# Patient Record
Sex: Male | Born: 1960 | Race: White | Hispanic: No | Marital: Married | Smoking: Current some day smoker
Health system: Southern US, Community
[De-identification: ages and names within clinical notes are randomized; demographics above are authoritative.]

## PROBLEM LIST (undated history)

## (undated) DIAGNOSIS — E119 Type 2 diabetes mellitus without complications: Secondary | ICD-10-CM

## (undated) DIAGNOSIS — L4050 Arthropathic psoriasis, unspecified: Secondary | ICD-10-CM

## (undated) DIAGNOSIS — I10 Essential (primary) hypertension: Secondary | ICD-10-CM

## (undated) DIAGNOSIS — K219 Gastro-esophageal reflux disease without esophagitis: Secondary | ICD-10-CM

## (undated) HISTORY — PX: CARDIAC CATHETERIZATION: SHX172

## (undated) HISTORY — PX: CARPAL TUNNEL RELEASE: SHX101

## (undated) HISTORY — PX: APPENDECTOMY: SHX54

---

## 2003-02-18 HISTORY — PX: CORONARY ARTERY BYPASS GRAFT: SHX141

## 2004-01-17 ENCOUNTER — Ambulatory Visit: Payer: Self-pay | Admitting: Cardiology

## 2004-02-27 ENCOUNTER — Encounter: Payer: Self-pay | Admitting: Cardiology

## 2004-03-20 ENCOUNTER — Encounter: Payer: Self-pay | Admitting: Cardiology

## 2004-04-17 ENCOUNTER — Encounter: Payer: Self-pay | Admitting: Cardiology

## 2008-02-26 ENCOUNTER — Emergency Department: Payer: Self-pay

## 2009-03-30 ENCOUNTER — Ambulatory Visit: Payer: Self-pay | Admitting: Cardiology

## 2010-10-04 ENCOUNTER — Ambulatory Visit: Payer: Self-pay | Admitting: Internal Medicine

## 2016-01-31 ENCOUNTER — Telehealth: Payer: Self-pay | Admitting: Gastroenterology

## 2016-01-31 NOTE — Telephone Encounter (Signed)
colonoscopy

## 2016-02-05 ENCOUNTER — Telehealth: Payer: Self-pay | Admitting: Gastroenterology

## 2016-02-05 NOTE — Telephone Encounter (Signed)
Patient returning a call regarding a colonoscopy °

## 2016-02-05 NOTE — Telephone Encounter (Signed)
LVM for patient to return call to schedule colonoscopy.

## 2016-02-08 NOTE — Telephone Encounter (Signed)
Patient is returning your call regarding a colonoscopy °

## 2016-02-13 ENCOUNTER — Other Ambulatory Visit: Payer: Self-pay

## 2016-02-13 NOTE — Telephone Encounter (Signed)
Gastroenterology Pre-Procedure Review  Request Date:  Requesting Physician: Dr.   PATIENT REVIEW QUESTIONS: The patient responded to the following health history questions as indicated:    1. Are you having any GI issues? no 2. Do you have a personal history of Polyps? no 3. Do you have a family history of Colon Cancer or Polyps? no 4. Diabetes Mellitus? yes (Type 2) 5. Joint replacements in the past 12 months?no 6. Major health problems in the past 3 months?no 7. Any artificial heart valves, MVP, or defibrillator? Stent x 4 years - triple bypass x 10 years   MEDICATIONS & ALLERGIES:    Patient reports the following regarding taking any anticoagulation/antiplatelet therapy:   Plavix, Coumadin, Eliquis, Xarelto, Lovenox, Pradaxa, Brilinta, or Effient? yes (Plavix 75mg  )  Dr. Ubaldo Glassing Aspirin? yes (ASA 325mg )   Patient confirms/reports the following medications:  No current outpatient prescriptions on file.   No current facility-administered medications for this visit.     Patient confirms/reports the following allergies:  Allergies not on file  No orders of the defined types were placed in this encounter.   AUTHORIZATION INFORMATION Primary Insurance: 1D#: Group #:  Secondary Insurance: 1D#: Group #:  SCHEDULE INFORMATION: Date: 03/07/16 Time: Location: [place]

## 2016-02-15 NOTE — Telephone Encounter (Signed)
Pt scheduled for a screening colonoscopy on Friday, Jan 19th in [facility] with [facility].   Please precert.

## 2016-02-19 ENCOUNTER — Telehealth: Payer: Self-pay

## 2016-02-19 NOTE — Telephone Encounter (Signed)
The notification/prior authorization case information was transmitted on 02/19/2016 at 10:17 AM CST. The notification/prior authorization reference number is [id number].

## 2016-02-29 ENCOUNTER — Telehealth: Payer: Self-pay

## 2016-02-29 NOTE — Telephone Encounter (Signed)
LVM for pt to return my call. Advised pt per Dr. Ubaldo Glassing on VM, Ok to stop Plavix 75mg  days prior to colonoscopy that is currently scheduled for 03/07/16 and to restart 1 day after.

## 2016-03-03 ENCOUNTER — Encounter: Payer: Self-pay | Admitting: *Deleted

## 2016-03-04 NOTE — Discharge Instructions (Signed)

## 2016-03-07 ENCOUNTER — Ambulatory Visit: Payer: 59 | Admitting: Anesthesiology

## 2016-03-07 ENCOUNTER — Encounter
Admission: RE | Disposition: A | Discharge status: Home / Self Care | Payer: Self-pay | Source: Ambulatory Visit | Attending: Gastroenterology

## 2016-03-07 ENCOUNTER — Ambulatory Visit
Admission: RE | Admit: 2016-03-07 | Discharge: 2016-03-07 | Disposition: A | Discharge status: Home / Self Care | Payer: 59 | Source: Ambulatory Visit | Attending: Gastroenterology | Admitting: Gastroenterology

## 2016-03-07 DIAGNOSIS — K5730 Diverticulosis of large intestine without perforation or abscess without bleeding: Secondary | ICD-10-CM | POA: Insufficient documentation

## 2016-03-07 DIAGNOSIS — Z1211 Encounter for screening for malignant neoplasm of colon: Secondary | ICD-10-CM | POA: Diagnosis present

## 2016-03-07 DIAGNOSIS — Z7982 Long term (current) use of aspirin: Secondary | ICD-10-CM | POA: Diagnosis not present

## 2016-03-07 DIAGNOSIS — F17290 Nicotine dependence, other tobacco product, uncomplicated: Secondary | ICD-10-CM | POA: Insufficient documentation

## 2016-03-07 DIAGNOSIS — K640 First degree hemorrhoids: Secondary | ICD-10-CM | POA: Insufficient documentation

## 2016-03-07 DIAGNOSIS — I10 Essential (primary) hypertension: Secondary | ICD-10-CM | POA: Insufficient documentation

## 2016-03-07 DIAGNOSIS — Z951 Presence of aortocoronary bypass graft: Secondary | ICD-10-CM | POA: Insufficient documentation

## 2016-03-07 DIAGNOSIS — Z79899 Other long term (current) drug therapy: Secondary | ICD-10-CM | POA: Insufficient documentation

## 2016-03-07 DIAGNOSIS — Z7984 Long term (current) use of oral hypoglycemic drugs: Secondary | ICD-10-CM | POA: Insufficient documentation

## 2016-03-07 DIAGNOSIS — K219 Gastro-esophageal reflux disease without esophagitis: Secondary | ICD-10-CM | POA: Diagnosis not present

## 2016-03-07 DIAGNOSIS — E119 Type 2 diabetes mellitus without complications: Secondary | ICD-10-CM | POA: Diagnosis not present

## 2016-03-07 DIAGNOSIS — D122 Benign neoplasm of ascending colon: Secondary | ICD-10-CM | POA: Diagnosis not present

## 2016-03-07 DIAGNOSIS — Z7902 Long term (current) use of antithrombotics/antiplatelets: Secondary | ICD-10-CM | POA: Insufficient documentation

## 2016-03-07 DIAGNOSIS — L539 Erythematous condition, unspecified: Secondary | ICD-10-CM | POA: Diagnosis not present

## 2016-03-07 HISTORY — PX: POLYPECTOMY: SHX5525

## 2016-03-07 HISTORY — DX: Gastro-esophageal reflux disease without esophagitis: K21.9

## 2016-03-07 HISTORY — DX: Essential (primary) hypertension: I10

## 2016-03-07 HISTORY — DX: Type 2 diabetes mellitus without complications: E11.9

## 2016-03-07 HISTORY — PX: COLONOSCOPY WITH PROPOFOL: SHX5780

## 2016-03-07 LAB — GLUCOSE, CAPILLARY
GLUCOSE-CAPILLARY: 152 mg/dL — AB (ref 65–99)
Glucose-Capillary: 135 mg/dL — ABNORMAL HIGH (ref 65–99)

## 2016-03-07 SURGERY — COLONOSCOPY WITH PROPOFOL
Anesthesia: Monitor Anesthesia Care | Wound class: Contaminated

## 2016-03-07 MED ORDER — PROPOFOL 10 MG/ML IV BOLUS
INTRAVENOUS | Status: DC | PRN
  Administered 2016-03-07: 20 mg via INTRAVENOUS
  Administered 2016-03-07 (×2): 40 mg via INTRAVENOUS
  Administered 2016-03-07: 70 mg via INTRAVENOUS
  Administered 2016-03-07: 20 mg via INTRAVENOUS
  Administered 2016-03-07: 30 mg via INTRAVENOUS
  Administered 2016-03-07: 20 mg via INTRAVENOUS

## 2016-03-07 MED ORDER — STERILE WATER FOR IRRIGATION IR SOLN
Status: DC | PRN
  Administered 2016-03-07: 08:00:00

## 2016-03-07 MED ORDER — LIDOCAINE HCL (CARDIAC) 20 MG/ML IV SOLN
INTRAVENOUS | Status: DC | PRN
  Administered 2016-03-07: 40 mg via INTRAVENOUS

## 2016-03-07 MED ORDER — LACTATED RINGERS IV SOLN
INTRAVENOUS | Status: DC
  Administered 2016-03-07: 08:00:00 via INTRAVENOUS

## 2016-03-07 SURGICAL SUPPLY — 23 items

## 2016-03-07 NOTE — Anesthesia Procedure Notes (Signed)
Procedure Name: MAC Performed by: Mayme Genta Pre-anesthesia Checklist: Patient identified, Emergency Drugs available, Suction available, Timeout performed and Patient being monitored Patient Re-evaluated:Patient Re-evaluated prior to inductionOxygen Delivery Method: Nasal cannula Placement Confirmation: positive ETCO2

## 2016-03-07 NOTE — Anesthesia Preprocedure Evaluation (Signed)
Anesthesia Evaluation  Patient identified by MRN, date of birth, ID band Patient awake    Reviewed: Allergy & Precautions, H&P , NPO status , Patient's Chart, lab work & pertinent test results, reviewed documented beta blocker date and time   Airway Mallampati: II  TM Distance: >3 FB Neck ROM: full    Dental no notable dental hx.    Pulmonary Current Smoker,    Pulmonary exam normal breath sounds clear to auscultation       Cardiovascular Exercise Tolerance: Good hypertension,  Rhythm:regular Rate:Normal     Neuro/Psych negative neurological ROS  negative psych ROS   GI/Hepatic Neg liver ROS, GERD  ,  Endo/Other  diabetes, Type 2  Renal/GU negative Renal ROS  negative genitourinary   Musculoskeletal   Abdominal   Peds  Hematology negative hematology ROS (+)   Anesthesia Other Findings   Reproductive/Obstetrics negative OB ROS                             Anesthesia Physical Anesthesia Plan  ASA: II  Anesthesia Plan: MAC   Post-op Pain Management:    Induction:   Airway Management Planned:   Additional Equipment:   Intra-op Plan:   Post-operative Plan:   Informed Consent: I have reviewed the patients History and Physical, chart, labs and discussed the procedure including the risks, benefits and alternatives for the proposed anesthesia with the patient or authorized representative who has indicated his/her understanding and acceptance.   Dental Advisory Given  Plan Discussed with: CRNA  Anesthesia Plan Comments:         Anesthesia Quick Evaluation

## 2016-03-07 NOTE — Op Note (Signed)
[facility] Gastroenterology Patient Name: Isaiah Harrison Procedure Date: 03/07/2016 8:00 AM MRN: [id number] Account #: [id number] Date of Birth: 1961/02/03 Admit Type: Outpatient Age: 56 Room: [facility] OR ROOM 01 Gender: Male Note Status: Finalized Procedure:            Colonoscopy Indications:          Screening for colorectal malignant neoplasm Providers:            Lucilla Lame MD, MD Referring MD:         Rusty Aus, MD (Referring MD) Medicines:            Propofol per Anesthesia Complications:        No immediate complications. Procedure:            Pre-Anesthesia Assessment:                       - Prior to the procedure, a History and Physical was                        performed, and patient medications and allergies were                        reviewed. The patient's tolerance of previous                        anesthesia was also reviewed. The risks and benefits of                        the procedure and the sedation options and risks were                        discussed with the patient. All questions were                        answered, and informed consent was obtained. Prior                        Anticoagulants: The patient has taken no previous                        anticoagulant or antiplatelet agents. ASA Grade                        Assessment: II - A patient with mild systemic disease.                        After reviewing the risks and benefits, the patient was                        deemed in satisfactory condition to undergo the                        procedure.                       After obtaining informed consent, the colonoscope was                        passed under direct vision. Throughout the procedure,  the patient's blood pressure, pulse, and oxygen                        saturations were monitored continuously. The was                        introduced through the anus and advanced to the the             cecum, identified by appendiceal orifice and ileocecal                        valve. The colonoscopy was performed without                        difficulty. The patient tolerated the procedure well.                        The quality of the bowel preparation was excellent. Findings:      The perianal and digital rectal examinations were normal.      A 5 mm polyp was found in the ascending colon. The polyp was sessile.       The polyp was removed with a cold biopsy forceps. Resection and       retrieval were complete.      A patchy area of mildly erythematous mucosa was found in the sigmoid       colon. Biopsies were taken with a cold forceps for histology.      Non-bleeding internal hemorrhoids were found during retroflexion. The       hemorrhoids were Grade I (internal hemorrhoids that do not prolapse).      Multiple small-mouthed diverticula were found in the sigmoid colon. Impression:           - One 5 mm polyp in the ascending colon, removed with a                        cold biopsy forceps. Resected and retrieved.                       - Erythematous mucosa in the sigmoid colon. Biopsied.                       - Non-bleeding internal hemorrhoids.                       - Diverticulosis in the sigmoid colon. Recommendation:       - Discharge patient to home.                       - Resume previous diet.                       - Continue present medications.                       - Await pathology results.                       - Repeat colonoscopy in 5 years if polyp adenoma and 10                        years if hyperplastic Procedure Code(s):    ---  Professional ---                       [phone], Colonoscopy, flexible; with biopsy, single or                        multiple Diagnosis Code(s):    --- Professional ---                       Z12.11, Encounter for screening for malignant neoplasm                        of colon                       D12.2, Benign neoplasm of  ascending colon CPT copyright 2016 American Medical Association. All rights reserved. The codes documented in this report are preliminary and upon coder review may  be revised to meet current compliance requirements. Lucilla Lame MD, MD 03/07/2016 8:31:00 AM This report has been signed electronically. Number of Addenda: 0 Note Initiated On: 03/07/2016 8:00 AM Scope Withdrawal Time: 0 hours 7 minutes 26 seconds  Total Procedure Duration: 0 hours 9 minutes 27 seconds       [facility]

## 2016-03-07 NOTE — H&P (Signed)
Lucilla Lame, MD [facility] [address], [place], [place] [address] Phone: [phone] Fax : [phone]  Primary Care Physician:  No primary care provider on file. Primary Gastroenterologist:  Dr. Allen Norris  Pre-Procedure History & Physical: HPI:  Isaiah Harrison is a 56 y.o. male is here for a screening colonoscopy.   Past Medical History:  Diagnosis Date  . Diabetes mellitus without complication ([place])   . GERD (gastroesophageal reflux disease)   . Hypertension     Past Surgical History:  Procedure Laterality Date  . APPENDECTOMY    . CARDIAC CATHETERIZATION     stent placed (several yrs ago)  . CARPAL TUNNEL RELEASE Bilateral   . CORONARY ARTERY BYPASS GRAFT  2005   3 vessel. [facility]    Prior to Admission medications   Medication Sig Start Date End Date Taking? Authorizing Provider  amLODipine-benazepril (LOTREL) 5-20 MG capsule Take 1 capsule by mouth daily.   Yes Historical Provider, MD  aspirin 325 MG tablet Take 325 mg by mouth daily.   Yes Historical Provider, MD  atorvastatin (LIPITOR) 40 MG tablet Take 40 mg by mouth daily.   Yes Historical Provider, MD  clopidogrel (PLAVIX) 75 MG tablet Take 75 mg by mouth daily.   Yes Historical Provider, MD  doxazosin (CARDURA) 4 MG tablet Take 4 mg by mouth daily.   Yes Historical Provider, MD  etodolac (LODINE) 400 MG tablet Take 400 mg by mouth daily.   Yes Historical Provider, MD  gemfibrozil (LOPID) 600 MG tablet Take 600 mg by mouth 2 (two) times daily before a meal.   Yes Historical Provider, MD  isosorbide mononitrate (IMDUR) 30 MG 24 hr tablet Take 30 mg by mouth daily.   Yes Historical Provider, MD  metFORMIN (GLUCOPHAGE) 500 MG tablet Take by mouth 2 (two) times daily with a meal.   Yes Historical Provider, MD  metoprolol (LOPRESSOR) 100 MG tablet Take 100 mg by mouth daily.   Yes Historical Provider, MD  nitroGLYCERIN (NITROSTAT) 0.4 MG SL tablet Place 0.4 mg under the tongue every 5 (five) minutes as needed for chest  pain.   Yes Historical Provider, MD  pantoprazole (PROTONIX) 40 MG tablet Take 40 mg by mouth daily.   Yes Historical Provider, MD  Probiotic Product (PROBIOTIC DAILY PO) Take by mouth daily.   Yes Historical Provider, MD    Allergies as of 02/13/2016  . (No Known Allergies)    History reviewed. No pertinent family history.  Social History   Social History  . Marital status: Married    Spouse name: N/A  . Number of children: N/A  . Years of education: N/A   Occupational History  . Not on file.   Social History Main Topics  . Smoking status: Current Some Day Smoker    Types: Cigars  . Smokeless tobacco: Never Used     Comment: occasional  . Alcohol use 16.8 oz/week    28 Shots of liquor per week  . Drug use: Unknown  . Sexual activity: Not on file   Other Topics Concern  . Not on file   Social History Narrative  . No narrative on file    Review of Systems: See HPI, otherwise negative ROS  Physical Exam: BP (!) 144/92   Pulse 82   Temp 97.4 F (36.3 C) (Temporal)   Resp 16   Ht 5\' 9"  (1.753 m)   Wt 253 lb (114.8 kg)   SpO2 98%   BMI 37.36 kg/m  General:  Alert,  pleasant and cooperative in NAD Head:  Normocephalic and atraumatic. Neck:  Supple; no masses or thyromegaly. Lungs:  Clear throughout to auscultation.    Heart:  Regular rate and rhythm. Abdomen:  Soft, nontender and nondistended. Normal bowel sounds, without guarding, and without rebound.   Neurologic:  Alert and  oriented x4;  grossly normal neurologically.  Impression/Plan: Isaiah Harrison is now here to undergo a screening colonoscopy.  Risks, benefits, and alternatives regarding colonoscopy have been reviewed with the patient.  Questions have been answered.  All parties agreeable.

## 2016-03-07 NOTE — Anesthesia Postprocedure Evaluation (Signed)
Anesthesia Post Note  Patient: Isaiah Harrison  Procedure(s) Performed: Procedure(s) (LRB): COLONOSCOPY WITH PROPOFOL (N/A) POLYPECTOMY  Patient location during evaluation: PACU Anesthesia Type: MAC Level of consciousness: awake and alert Pain management: pain level controlled Vital Signs Assessment: post-procedure vital signs reviewed and stable Respiratory status: spontaneous breathing, nonlabored ventilation, respiratory function stable and patient connected to nasal cannula oxygen Cardiovascular status: stable and blood pressure returned to baseline Anesthetic complications: no    Alisa Graff

## 2016-03-07 NOTE — Transfer of Care (Signed)
Immediate Anesthesia Transfer of Care Note  Patient: Isaiah Harrison  Procedure(s) Performed: Procedure(s) with comments: COLONOSCOPY WITH PROPOFOL (N/A) - Diabetic - oral meds POLYPECTOMY  Patient Location: PACU  Anesthesia Type: MAC  Level of Consciousness: awake, alert  and patient cooperative  Airway and Oxygen Therapy: Patient Spontanous Breathing and Patient connected to supplemental oxygen  Post-op Assessment: Post-op Vital signs reviewed, Patient's Cardiovascular Status Stable, Respiratory Function Stable, Patent Airway and No signs of Nausea or vomiting  Post-op Vital Signs: Reviewed and stable  Complications: No apparent anesthesia complications

## 2016-03-10 ENCOUNTER — Encounter: Payer: Self-pay | Admitting: Gastroenterology

## 2016-03-11 ENCOUNTER — Encounter: Payer: Self-pay | Admitting: Gastroenterology

## 2016-08-28 ENCOUNTER — Other Ambulatory Visit: Payer: Self-pay | Admitting: Internal Medicine

## 2016-08-28 DIAGNOSIS — M5116 Intervertebral disc disorders with radiculopathy, lumbar region: Secondary | ICD-10-CM

## 2016-09-04 ENCOUNTER — Ambulatory Visit: Payer: 59

## 2016-09-09 ENCOUNTER — Ambulatory Visit
Admission: RE | Admit: 2016-09-09 | Discharge: 2016-09-09 | Disposition: A | Discharge status: Home / Self Care | Payer: 59 | Source: Ambulatory Visit | Attending: Internal Medicine | Admitting: Internal Medicine

## 2016-09-09 DIAGNOSIS — M48061 Spinal stenosis, lumbar region without neurogenic claudication: Secondary | ICD-10-CM | POA: Diagnosis not present

## 2016-09-09 DIAGNOSIS — M5136 Other intervertebral disc degeneration, lumbar region: Secondary | ICD-10-CM | POA: Insufficient documentation

## 2016-09-09 DIAGNOSIS — M5116 Intervertebral disc disorders with radiculopathy, lumbar region: Secondary | ICD-10-CM | POA: Insufficient documentation

## 2018-07-07 IMAGING — MR MR LUMBAR SPINE W/O CM
4 of 5 series · 25 of 48 positions shown · non-contrast
Comparison: None.

CLINICAL DATA: Lumbar radiculopathy. Right leg numbness and
weakness for 3-4 years, progressively worsening.

EXAM:
MRI LUMBAR SPINE WITHOUT CONTRAST
TECHNIQUE: Multiplanar, multisequence MR imaging of the lumbar spine was
performed. No intravenous contrast was administered.

[Series 2: T2 · sagittal · 4.0mm · 0.81mm/px · 6 of 15 slices shown (1 of 2)]
[im 1/15]
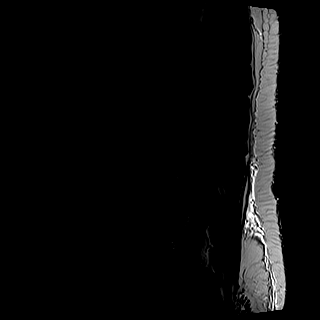
[im 3/15]
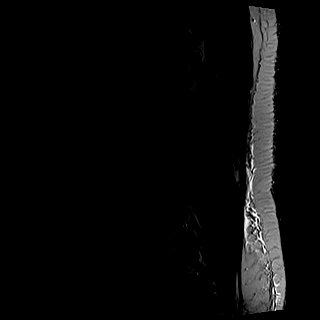
[im 6/15]
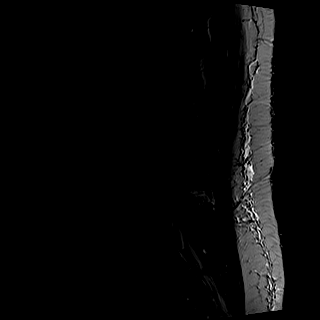
[im 9/15]
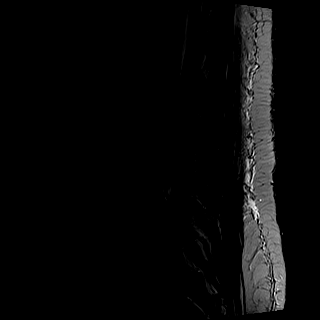
[im 12/15]
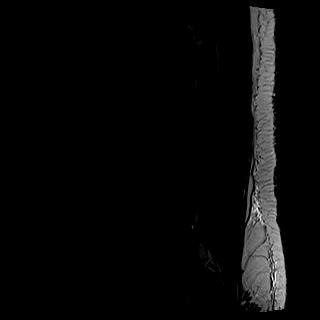
[im 15/15]
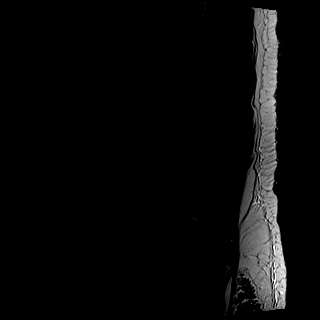

[Series 3: T1 · sagittal · 4.0mm · 0.41mm/px · 6 of 15 slices shown (1 of 2)]
[im 1/15]
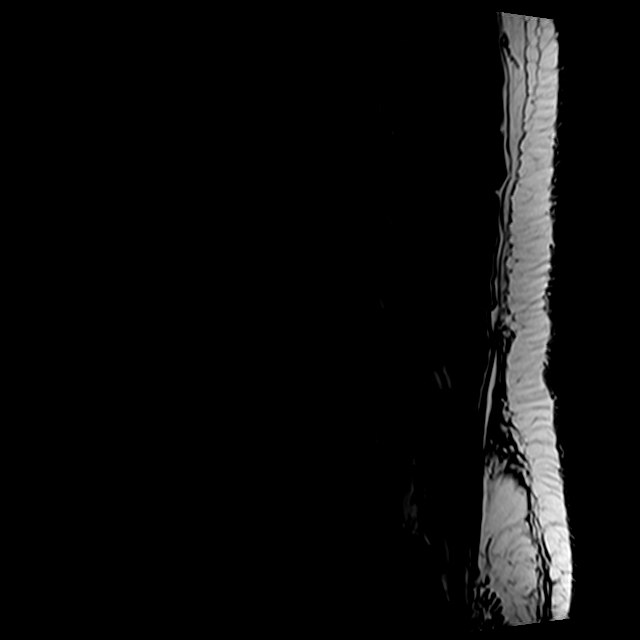
[im 3/15]
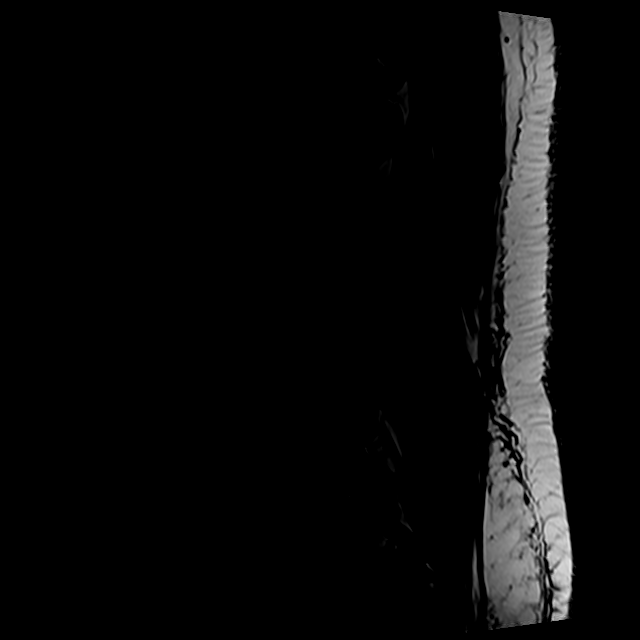
[im 6/15]
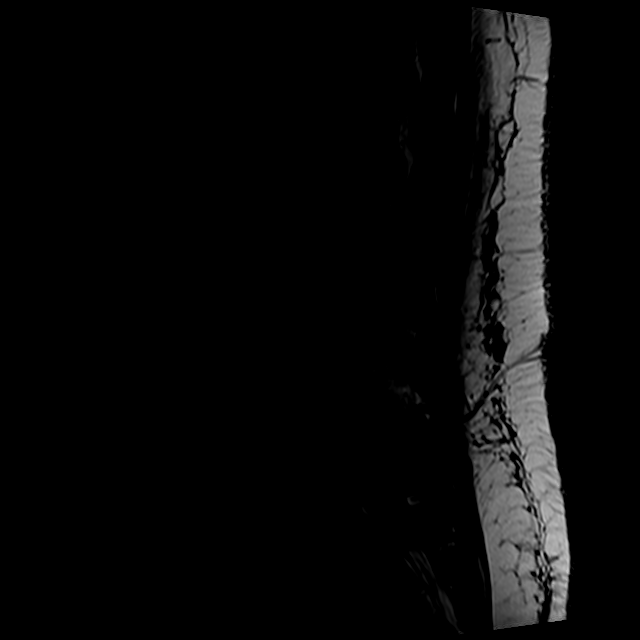
[im 9/15]
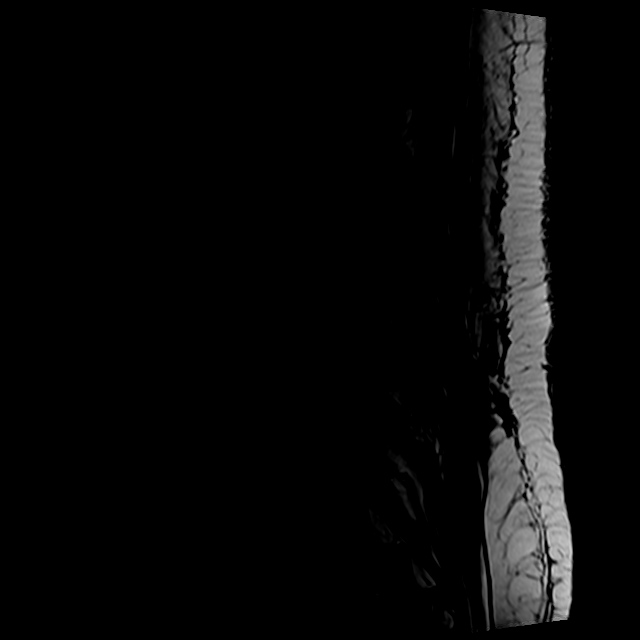
[im 12/15]
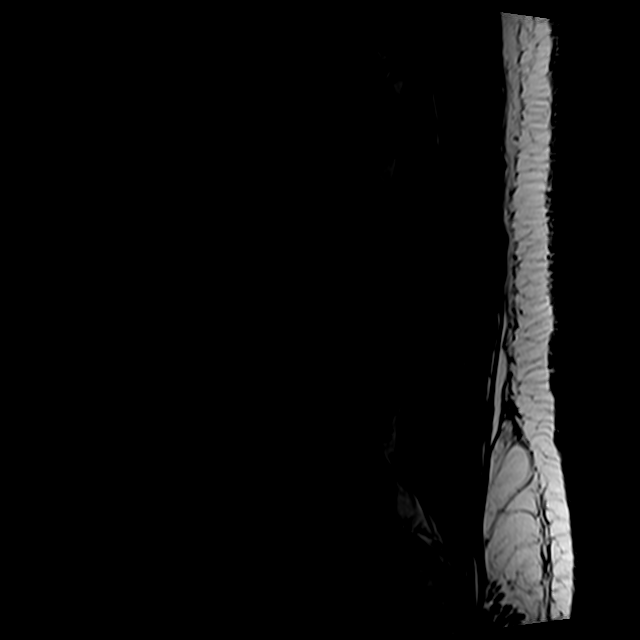
[im 15/15]
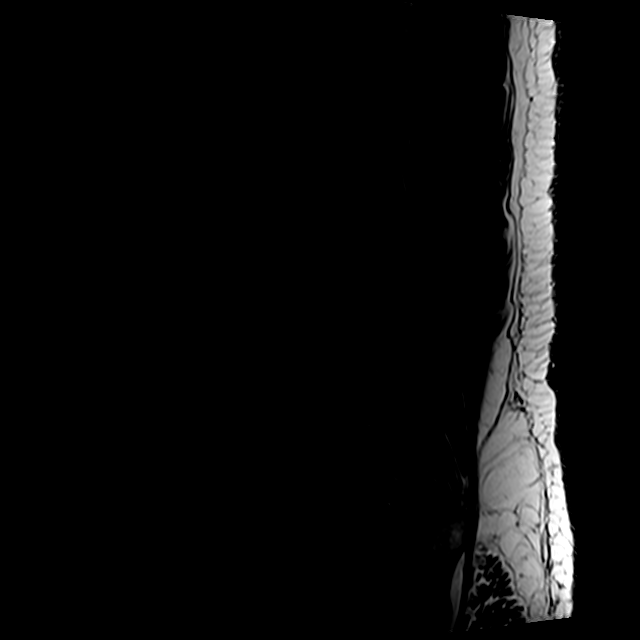

[Series 5: T2 · axial · 4.0mm · 0.78mm/px · z∈[-78,+130]mm · 9 of 38 slices shown (2 of 2)]
[im 1/38]
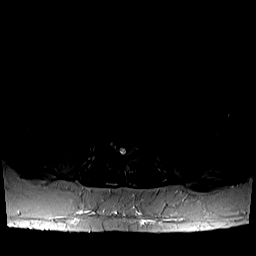
[im 6/38]
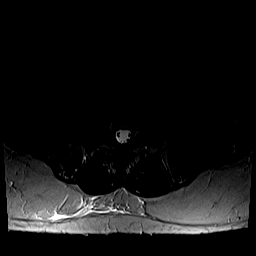
[im 11/38]
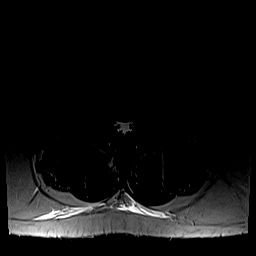
[im 16/38]
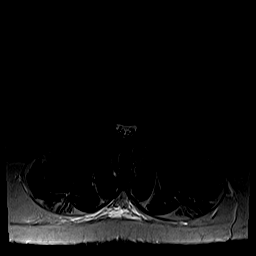
[im 19/38]
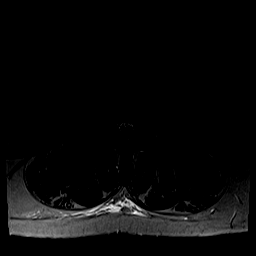
[im 22/38]
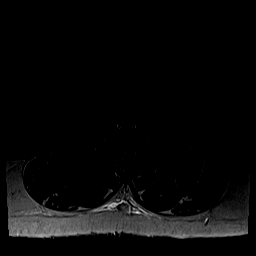
[im 27/38]
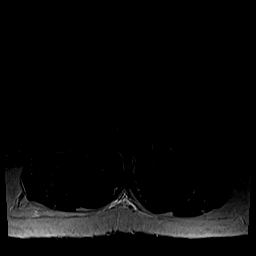
[im 32/38]
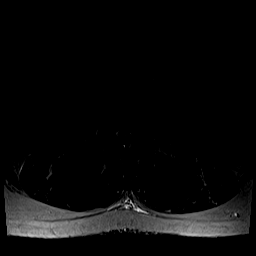
[im 38/38]
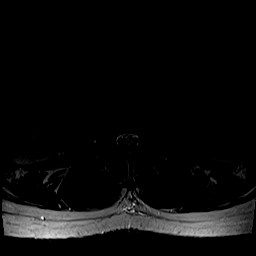

[Series 6: T1 · axial · 4.0mm · 0.39mm/px · z∈[-78,+100]mm · 4 of 38 slices shown (2 of 2)]
[im 1/38]
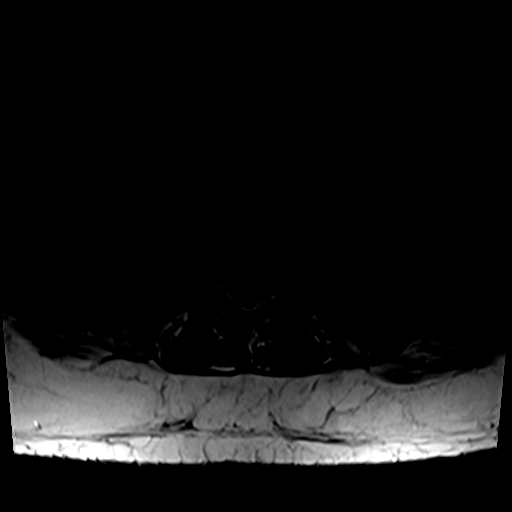
[im 6/38]
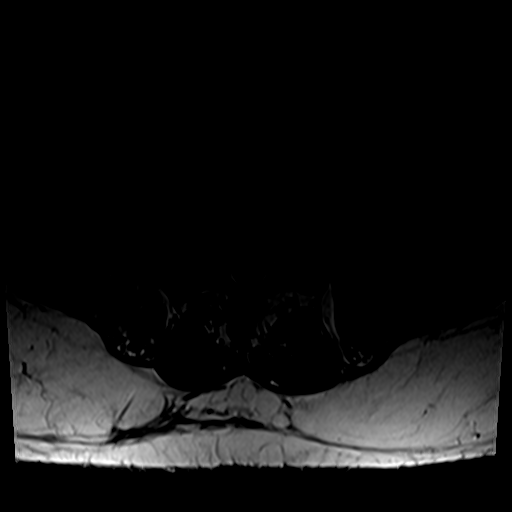
[im 19/38]
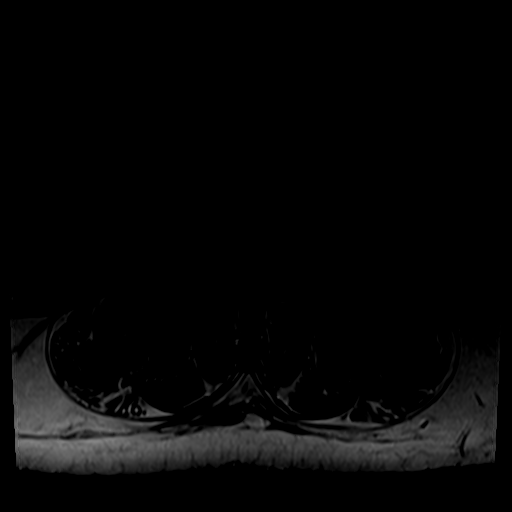
[im 32/38]
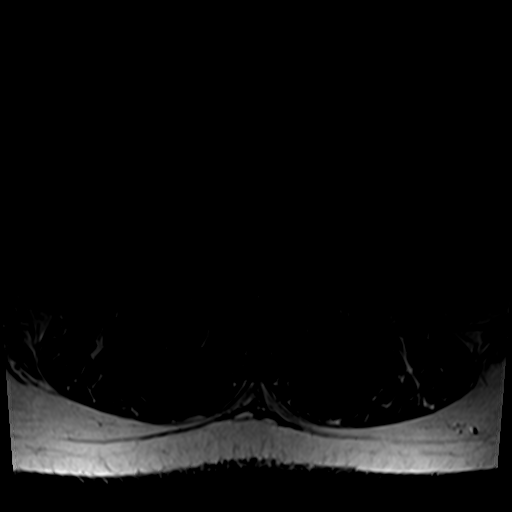

[25 of 48 positions shown; findings below may reference images not displayed]

FINDINGS: Segmentation: Normal lumbar segmentation is assumed, with the lowest
fully formed disc space designated L5-S1.

Alignment:  Normal.

Vertebrae: No fracture, osseous lesion, or marrow edema. The diffuse
congenital narrowing of the lumbar spinal canal due to short
pedicles.

Conus medullaris: Extends to the T12-L1 level and appears normal.

Paraspinal and other soft tissues: Unremarkable.

Disc levels:

L1-2: 7 x 2 mm cyst along the medial aspect of the right facet joint
deep to the ligament, not resulting in stenosis or neural
impingement. No disc herniation, spinal stenosis, or neural
foraminal stenosis.

L2-3: Left extraforaminal disc protrusion with annular fissure could
affect the extraforaminal L2 nerve. Minimal left lateral recess
narrowing. No significant spinal or neural foraminal stenosis.

L3-4: Mild disc bulging and congenitally short pedicles result in
minimal left greater than right neural foraminal stenosis without
spinal stenosis.

L4-5: Disc desiccation and minimal disc space narrowing. Minimal
disc bulging, a shallow left foraminal/ extraforaminal disc
protrusion, congenitally short pedicles, and mild facet arthrosis
result in minimal right and mild left neural foraminal stenosis
without spinal stenosis.

L5-S1:  Minimal facet arthrosis without stenosis.
IMPRESSION: 1. Congenital lumbar spinal stenosis. Mild disc and facet
degeneration without significant acquired spinal stenosis.
2. Minimal right neural foraminal narrowing at L3-4 and L4-5. No
clear right-sided neural impingement.
3. Left extraforaminal disc protrusion at L2-3.
4. Mild left neural foraminal stenosis at L4-5.

## 2018-11-15 ENCOUNTER — Ambulatory Visit
Admission: RE | Admit: 2018-11-15 | Discharge: 2018-11-15 | Disposition: A | Discharge status: Home / Self Care | Payer: Managed Care, Other (non HMO) | Source: Ambulatory Visit | Attending: Internal Medicine | Admitting: Internal Medicine

## 2018-11-15 ENCOUNTER — Other Ambulatory Visit: Payer: Self-pay | Admitting: Internal Medicine

## 2018-11-15 ENCOUNTER — Other Ambulatory Visit: Payer: Self-pay

## 2018-11-15 DIAGNOSIS — R042 Hemoptysis: Secondary | ICD-10-CM | POA: Insufficient documentation

## 2018-11-15 DIAGNOSIS — R0789 Other chest pain: Secondary | ICD-10-CM | POA: Insufficient documentation

## 2018-11-15 LAB — POCT I-STAT CREATININE: Creatinine, Ser: 0.7 mg/dL (ref 0.61–1.24)

## 2018-11-15 MED ORDER — IOHEXOL 350 MG/ML SOLN
75.0000 mL | Freq: Once | INTRAVENOUS | Status: AC | PRN
  Administered 2018-11-15: 13:00:00 75 mL via INTRAVENOUS

## 2021-02-16 ENCOUNTER — Encounter: Payer: Self-pay | Admitting: Internal Medicine

## 2021-02-21 ENCOUNTER — Telehealth: Payer: Self-pay

## 2021-02-21 NOTE — Telephone Encounter (Signed)
CALLED PATIENT NO ANSWER LEFT VOICEMAIL FOR A CALL BACK ? ?

## 2021-02-25 ENCOUNTER — Telehealth: Payer: Self-pay

## 2021-02-25 ENCOUNTER — Other Ambulatory Visit: Payer: Self-pay

## 2021-02-25 DIAGNOSIS — Z1211 Encounter for screening for malignant neoplasm of colon: Secondary | ICD-10-CM

## 2021-02-25 MED ORDER — PEG 3350-KCL-NA BICARB-NACL 420 G PO SOLR
4000.0000 mL | Freq: Once | ORAL | 0 refills | Status: AC
Start: 2021-02-25 — End: 2021-02-25

## 2021-02-25 NOTE — Progress Notes (Signed)
Gastroenterology Pre-Procedure Review  Request Date: 03/21/2021 Requesting Physician: Dr. Allen Norris  PATIENT REVIEW QUESTIONS: The patient responded to the following health history questions as indicated:    1. Are you having any GI issues? no 2. Do you have a personal history of Polyps? no 3. Do you have a family history of Colon Cancer or Polyps? no 4. Diabetes Mellitus? yes (type 2) 5. Joint replacements in the past 12 months?no 6. Major health problems in the past 3 months?no 7. Any artificial heart valves, MVP, or defibrillator?no    MEDICATIONS & ALLERGIES:    Patient reports the following regarding taking any anticoagulation/antiplatelet therapy:   Plavix, Coumadin, Eliquis, Xarelto, Lovenox, Pradaxa, Brilinta, or Effient? yes (PLAVIX) Aspirin? yes (asprin 325)  Patient confirms/reports the following medications:  Current Outpatient Medications  Medication Sig Dispense Refill   amLODipine-benazepril (LOTREL) 5-20 MG capsule Take 1 capsule by mouth daily.     aspirin 325 MG tablet Take 325 mg by mouth daily.     atorvastatin (LIPITOR) 40 MG tablet Take 40 mg by mouth daily.     clopidogrel (PLAVIX) 75 MG tablet Take 75 mg by mouth daily.     doxazosin (CARDURA) 4 MG tablet Take 4 mg by mouth daily.     etodolac (LODINE) 400 MG tablet Take 400 mg by mouth daily.     gemfibrozil (LOPID) 600 MG tablet Take 600 mg by mouth 2 (two) times daily before a meal.     isosorbide mononitrate (IMDUR) 30 MG 24 hr tablet Take 30 mg by mouth daily.     metFORMIN (GLUCOPHAGE) 500 MG tablet Take by mouth 2 (two) times daily with a meal.     metoprolol (LOPRESSOR) 100 MG tablet Take 100 mg by mouth daily.     nitroGLYCERIN (NITROSTAT) 0.4 MG SL tablet Place 0.4 mg under the tongue every 5 (five) minutes as needed for chest pain.     pantoprazole (PROTONIX) 40 MG tablet Take 40 mg by mouth daily.     Probiotic Product (PROBIOTIC DAILY PO) Take by mouth daily.     No current facility-administered  medications for this visit.    Patient confirms/reports the following allergies:  Allergies  Allergen Reactions   Almond (Diagnostic) Anaphylaxis    No orders of the defined types were placed in this encounter.   AUTHORIZATION INFORMATION Primary Insurance: 1D#: Group #:  Secondary Insurance: 1D#: Group #:  SCHEDULE INFORMATION: Date: 03/21/2021 Time: Location: [facility]

## 2021-02-25 NOTE — Telephone Encounter (Signed)
SENT CORRECTED INFORMATION INSTRUCTIONS

## 2021-02-25 NOTE — Telephone Encounter (Signed)
SCHEDULED FOR 03/21/2021 WITH [facility]

## 2021-03-05 ENCOUNTER — Telehealth: Payer: Self-pay

## 2021-03-05 ENCOUNTER — Encounter: Payer: Self-pay | Admitting: Gastroenterology

## 2021-03-05 NOTE — Telephone Encounter (Signed)
RESENT BLOOD THINNER REQUEST

## 2021-03-08 ENCOUNTER — Telehealth: Payer: Self-pay

## 2021-03-08 NOTE — Telephone Encounter (Signed)
CALLED PATIENT ABOUT HIS BLOOD THINNER PLAVIX HE CAN STOP 5 DAYS PRIOR TO PROCEDURE AND SENT NEW REQUEST TO DR.MILLER FOR THE ASPRIN 325

## 2021-03-14 ENCOUNTER — Telehealth: Payer: Self-pay

## 2021-03-14 NOTE — Telephone Encounter (Signed)
SENT BLOOD THINNER CLEARANCE AGAIN URGENT WAITING FOR REPLY

## 2021-03-14 NOTE — Telephone Encounter (Signed)
CALLED PATIENT ABOUT BLOOD THINNER CLEARANCE HE CAN STOP HIS PLAVIX 5 DAYS BEFORE PROCEDURE NOW WAITING ON THE ASPRIN 325 CLEARANCE

## 2021-03-15 ENCOUNTER — Telehealth: Payer: Self-pay

## 2021-03-15 NOTE — Telephone Encounter (Signed)
Rescheduled patient procedure still dont have the asprin clearance  has sent several times only get answer for the plavix patient rescheduled till 03/28/2021

## 2021-03-28 ENCOUNTER — Ambulatory Visit
Admission: RE | Disposition: A | Discharge status: Home / Self Care | Payer: Self-pay | Source: Home / Self Care | Attending: Gastroenterology

## 2021-03-28 ENCOUNTER — Ambulatory Visit: Payer: Managed Care, Other (non HMO) | Admitting: Anesthesiology

## 2021-03-28 ENCOUNTER — Other Ambulatory Visit: Payer: Self-pay

## 2021-03-28 ENCOUNTER — Encounter: Payer: Self-pay | Admitting: Gastroenterology

## 2021-03-28 ENCOUNTER — Ambulatory Visit
Admission: RE | Admit: 2021-03-28 | Discharge: 2021-03-28 | Disposition: A | Discharge status: Home / Self Care | Payer: Managed Care, Other (non HMO) | Attending: Gastroenterology | Admitting: Gastroenterology

## 2021-03-28 DIAGNOSIS — Z79899 Other long term (current) drug therapy: Secondary | ICD-10-CM | POA: Diagnosis not present

## 2021-03-28 DIAGNOSIS — Z1211 Encounter for screening for malignant neoplasm of colon: Secondary | ICD-10-CM | POA: Insufficient documentation

## 2021-03-28 DIAGNOSIS — F17290 Nicotine dependence, other tobacco product, uncomplicated: Secondary | ICD-10-CM | POA: Insufficient documentation

## 2021-03-28 DIAGNOSIS — K5730 Diverticulosis of large intestine without perforation or abscess without bleeding: Secondary | ICD-10-CM | POA: Diagnosis not present

## 2021-03-28 DIAGNOSIS — I10 Essential (primary) hypertension: Secondary | ICD-10-CM | POA: Insufficient documentation

## 2021-03-28 DIAGNOSIS — Z860100 Personal history of colon polyps, unspecified: Secondary | ICD-10-CM

## 2021-03-28 DIAGNOSIS — Z7984 Long term (current) use of oral hypoglycemic drugs: Secondary | ICD-10-CM | POA: Insufficient documentation

## 2021-03-28 DIAGNOSIS — K640 First degree hemorrhoids: Secondary | ICD-10-CM | POA: Diagnosis not present

## 2021-03-28 DIAGNOSIS — E119 Type 2 diabetes mellitus without complications: Secondary | ICD-10-CM | POA: Diagnosis not present

## 2021-03-28 HISTORY — PX: COLONOSCOPY WITH PROPOFOL: SHX5780

## 2021-03-28 HISTORY — DX: Arthropathic psoriasis, unspecified: L40.50

## 2021-03-28 LAB — GLUCOSE, CAPILLARY
Glucose-Capillary: 125 mg/dL — ABNORMAL HIGH (ref 70–99)
Glucose-Capillary: 129 mg/dL — ABNORMAL HIGH (ref 70–99)

## 2021-03-28 SURGERY — COLONOSCOPY WITH PROPOFOL
Anesthesia: General

## 2021-03-28 MED ORDER — PROPOFOL 10 MG/ML IV BOLUS
INTRAVENOUS | Status: DC | PRN
  Administered 2021-03-28 (×2): 20 mg via INTRAVENOUS
  Administered 2021-03-28: 40 mg via INTRAVENOUS
  Administered 2021-03-28 (×3): 20 mg via INTRAVENOUS
  Administered 2021-03-28: 100 mg via INTRAVENOUS

## 2021-03-28 MED ORDER — ONDANSETRON HCL 4 MG/2ML IJ SOLN: 4.0000 mg | Freq: Once | INTRAMUSCULAR | Status: DC | PRN

## 2021-03-28 MED ORDER — ACETAMINOPHEN 325 MG PO TABS: 325.0000 mg | ORAL_TABLET | ORAL | Status: DC | PRN

## 2021-03-28 MED ORDER — SODIUM CHLORIDE 0.9 % IV SOLN: INTRAVENOUS | Status: DC

## 2021-03-28 MED ORDER — ACETAMINOPHEN 160 MG/5ML PO SOLN: 325.0000 mg | ORAL | Status: DC | PRN

## 2021-03-28 MED ORDER — LACTATED RINGERS IV SOLN: INTRAVENOUS | Status: DC

## 2021-03-28 SURGICAL SUPPLY — 22 items

## 2021-03-28 NOTE — Anesthesia Preprocedure Evaluation (Signed)
Anesthesia Evaluation  Patient identified by MRN, date of birth, ID band Patient awake    Reviewed: Allergy & Precautions, H&P , NPO status , Patient's Chart, lab work & pertinent test results  History of Anesthesia Complications Negative for: history of anesthetic complications  Airway Mallampati: II  TM Distance: >3 FB Neck ROM: full    Dental no notable dental hx.    Pulmonary Current Smoker and Patient abstained from smoking.,    Pulmonary exam normal        Cardiovascular hypertension, On Medications  Rhythm:regular Rate:Normal  Some nitro use but patient describes negatives stress test last year.  No change in CP/SOB.  Off Plavix since Saturday.   Neuro/Psych negative neurological ROS  negative psych ROS   GI/Hepatic Neg liver ROS, Medicated,  Endo/Other  diabetes, Well Controlled, Type 2  Renal/GU negative Renal ROS  negative genitourinary   Musculoskeletal   Abdominal   Peds  Hematology negative hematology ROS (+)   Anesthesia Other Findings   Reproductive/Obstetrics                             Anesthesia Physical Anesthesia Plan  ASA: 3  Anesthesia Plan: General   Post-op Pain Management:    Induction:   PONV Risk Score and Plan: 1 and Ondansetron, TIVA, Midazolam and Treatment may vary due to age or medical condition  Airway Management Planned:   Additional Equipment:   Intra-op Plan:   Post-operative Plan:   Informed Consent: I have reviewed the patients History and Physical, chart, labs and discussed the procedure including the risks, benefits and alternatives for the proposed anesthesia with the patient or authorized representative who has indicated his/her understanding and acceptance.       Plan Discussed with:   Anesthesia Plan Comments:         Anesthesia Quick Evaluation

## 2021-03-28 NOTE — Anesthesia Procedure Notes (Signed)
Date/Time: 03/28/2021 7:40 AM Performed by: Cameron Ali, CRNA Pre-anesthesia Checklist: Patient identified, Emergency Drugs available, Suction available, Timeout performed and Patient being monitored Patient Re-evaluated:Patient Re-evaluated prior to induction Oxygen Delivery Method: Nasal cannula Placement Confirmation: positive ETCO2

## 2021-03-28 NOTE — H&P (Signed)
Lucilla Lame, MD [address] [place], [place] [address] Phone:[phone] Fax : [phone]  Primary Care Physician:  Rusty Aus, MD Primary Gastroenterologist:  Dr. Allen Norris  Pre-Procedure History & Physical: HPI:  Isaiah Harrison is a 61 y.o. male is here for an colonoscopy.   Past Medical History:  Diagnosis Date   Diabetes mellitus without complication (HCC)    GERD (gastroesophageal reflux disease)    Hypertension    Psoriatic arthritis ([place])     Past Surgical History:  Procedure Laterality Date   APPENDECTOMY     CARDIAC CATHETERIZATION     stent placed (several yrs ago)   CARPAL TUNNEL RELEASE Bilateral    COLONOSCOPY WITH PROPOFOL N/A 03/07/2016   Procedure: COLONOSCOPY WITH PROPOFOL;  Surgeon: Lucilla Lame, MD;  Location: [facility];  Service: Endoscopy;  Laterality: N/A;  Diabetic - oral meds   CORONARY ARTERY BYPASS GRAFT  2005   3 vessel. [facility]   POLYPECTOMY  03/07/2016   Procedure: POLYPECTOMY;  Surgeon: Lucilla Lame, MD;  Location: [facility];  Service: Endoscopy;;    Prior to Admission medications   Medication Sig Start Date End Date Taking? Authorizing Provider  Adalimumab (HUMIRA PEN) 40 MG/0.4ML PNKT Inject into the skin every 14 (fourteen) days.   Yes [provider]  amLODipine-benazepril (LOTREL) 5-20 MG capsule Take 1 capsule by mouth daily.   Yes [provider]  aspirin 325 MG tablet Take 325 mg by mouth daily.   Yes [provider]  atorvastatin (LIPITOR) 40 MG tablet Take 40 mg by mouth daily.   Yes [provider]  clopidogrel (PLAVIX) 75 MG tablet Take 75 mg by mouth daily.   Yes [provider]  doxazosin (CARDURA) 4 MG tablet Take 4 mg by mouth daily.   Yes [provider]  etodolac (LODINE) 400 MG tablet Take 400 mg by mouth daily.   Yes [provider]  gemfibrozil (LOPID) 600 MG tablet Take 600 mg by mouth 2 (two) times daily before a meal.   Yes  [provider]  isosorbide mononitrate (IMDUR) 30 MG 24 hr tablet Take 30 mg by mouth daily.   Yes [provider]  metFORMIN (GLUCOPHAGE) 500 MG tablet Take by mouth 2 (two) times daily with a meal.   Yes [provider]  metoprolol (LOPRESSOR) 100 MG tablet Take 100 mg by mouth daily.   Yes [provider]  nitroGLYCERIN (NITROSTAT) 0.4 MG SL tablet Place 0.4 mg under the tongue every 5 (five) minutes as needed for chest pain.   Yes [provider]  pantoprazole (PROTONIX) 40 MG tablet Take 40 mg by mouth daily.   Yes [provider]  Semaglutide (OZEMPIC, 1 MG/DOSE, [place]) Inject into the skin once a week.   Yes [provider]  Probiotic Product (PROBIOTIC DAILY PO) Take by mouth daily. 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 No pertinent family history.  Social History   Socioeconomic History   Marital status: Married    Spouse name: Not on file   Number of children: Not on file   Years of education: Not on file   Highest education level: Not on file  Occupational History   Not on file  Tobacco Use   Smoking status: Some Days  Types: Cigars   Smokeless tobacco: Never   Tobacco comments:    occasional  Substance and Sexual Activity   Alcohol use: Yes    Alcohol/week: 28.0 standard drinks    Types: 28 Shots of liquor per week    Comment: denies current use as of 03/05/21   Drug use: Not on file   Sexual activity: Not on file  Other Topics Concern   Not on file  Social History Narrative   Not on file   Social Determinants of Health   Financial Resource Strain: Not on file  Food Insecurity: Not on file  Transportation Needs: Not on file  Physical Activity: Not on file  Stress: Not on file  Social Connections: Not on file   Intimate Partner Violence: Not on file    Review of Systems: See HPI, otherwise negative ROS  Physical Exam: BP (!) 157/84    Pulse 89    Temp (!) 97.5 F (36.4 C) (Temporal)    Ht 5\' 9"  (1.753 m)    Wt 115.2 kg    SpO2 97%    BMI 37.51 kg/m  General:   Alert,  pleasant and cooperative in NAD Head:  Normocephalic and atraumatic. Neck:  Supple; no masses or thyromegaly. Lungs:  Clear throughout to auscultation.    Heart:  Regular rate and rhythm. Abdomen:  Soft, nontender and nondistended. Normal bowel sounds, without guarding, and without rebound.   Neurologic:  Alert and  oriented x4;  grossly normal neurologically.  Impression/Plan: Isaiah Harrison is here for an colonoscopy to be performed for a history of adenomatous polyps on 2018   Risks, benefits, limitations, and alternatives regarding  colonoscopy have been reviewed with the patient.  Questions have been answered.  All parties agreeable.   Lucilla Lame, MD  03/28/2021, 7:32 AM

## 2021-03-28 NOTE — Anesthesia Postprocedure Evaluation (Signed)
Anesthesia Post Note  Patient: Isaiah Harrison  Procedure(s) Performed: COLONOSCOPY WITH PROPOFOL     Patient location during evaluation: PACU Anesthesia Type: General Level of consciousness: awake and alert Pain management: pain level controlled Vital Signs Assessment: post-procedure vital signs reviewed and stable Respiratory status: spontaneous breathing Cardiovascular status: stable Anesthetic complications: no   No notable events documented.  Gillian Scarce

## 2021-03-28 NOTE — Op Note (Addendum)
[facility] Patient Name: Isaiah Harrison Procedure Date: 03/28/2021 7:19 AM MRN: [id number] Account #: [id number] Date of Birth: 1960-05-05 Admit Type: Outpatient Age: 61 Room: [facility] OR ROOM 01 Gender: Male Note Status: Finalized Instrument Name: [id number] Procedure:             Colonoscopy Indications:           High risk colon cancer surveillance: Personal history                         of colonic polyps Providers:             Lucilla Lame MD, MD Referring MD:          Rusty Aus, MD (Referring MD) Medicines:             Propofol per Anesthesia Complications:         No immediate complications. Procedure:             Pre-Anesthesia Assessment:                        - Prior to the procedure, a History and Physical was                         performed, and patient medications and allergies were                         reviewed. The patient's tolerance of previous                         anesthesia was also reviewed. The risks and benefits                         of the procedure and the sedation options and risks                         were discussed with the patient. All questions were                         answered, and informed consent was obtained. Prior                         Anticoagulants: The patient has taken no previous                         anticoagulant or antiplatelet agents. ASA Grade                         Assessment: II - A patient with mild systemic disease.                         After reviewing the risks and benefits, the patient                         was deemed in satisfactory condition to undergo the                         procedure.  After obtaining informed consent, the colonoscope was                         passed under direct vision. Throughout the procedure,                         the patient's blood pressure, pulse, and oxygen                         saturations were monitored  continuously. The                         Colonoscope was introduced through the anus and                         advanced to the the cecum, identified by appendiceal                         orifice and ileocecal valve. The colonoscopy was                         performed without difficulty. The patient tolerated                         the procedure well. The quality of the bowel                         preparation was excellent. Findings:      The perianal and digital rectal examinations were normal.      Multiple small-mouthed diverticula were found in the sigmoid colon.      Non-bleeding internal hemorrhoids were found during retroflexion. The       hemorrhoids were Grade I (internal hemorrhoids that do not prolapse). Impression:            - Diverticulosis in the sigmoid colon.                        - Non-bleeding internal hemorrhoids.                        - No specimens collected. Recommendation:        - Discharge patient to home.                        - Resume previous diet.                        - Continue present medications.                        - Repeat colonoscopy in 5 years for surveillance. Procedure Code(s):     --- Professional ---                        [phone], Colonoscopy, flexible; diagnostic, including                         collection of specimen(s) by brushing or washing, when  performed (separate procedure) Diagnosis Code(s):     --- Professional ---                        Z86.010, Personal history of colonic polyps CPT copyright 2019 [employer]. All rights reserved. The codes documented in this report are preliminary and upon coder review may  be revised to meet current compliance requirements. Lucilla Lame MD, MD 03/28/2021 7:57:05 AM This report has been signed electronically. Number of Addenda: 0 Note Initiated On: 03/28/2021 7:19 AM Scope Withdrawal Time: 0 hours 8 minutes 17 seconds  Total Procedure Duration:  0 hours 12 minutes 19 seconds  Estimated Blood Loss:  Estimated blood loss: none.      [facility]

## 2021-03-28 NOTE — Transfer of Care (Signed)
Immediate Anesthesia Transfer of Care Note  Patient: Isaiah Harrison  Procedure(s) Performed: COLONOSCOPY WITH PROPOFOL  Patient Location: PACU  Anesthesia Type: General  Level of Consciousness: awake, alert  and patient cooperative  Airway and Oxygen Therapy: Patient Spontanous Breathing and Patient connected to supplemental oxygen  Post-op Assessment: Post-op Vital signs reviewed, Patient's Cardiovascular Status Stable, Respiratory Function Stable, Patent Airway and No signs of Nausea or vomiting  Post-op Vital Signs: Reviewed and stable  Complications: No notable events documented.

## 2021-03-29 ENCOUNTER — Encounter: Payer: Self-pay | Admitting: Gastroenterology
